# Patient Record
Sex: Female | Born: 1960 | Race: Black or African American | Hispanic: No | Marital: Married | Smoking: Never smoker
Health system: Southern US, Community
[De-identification: ages and names within clinical notes are randomized; demographics above are authoritative.]

## PROBLEM LIST (undated history)

## (undated) DIAGNOSIS — Z8489 Family history of other specified conditions: Secondary | ICD-10-CM

## (undated) DIAGNOSIS — I82409 Acute embolism and thrombosis of unspecified deep veins of unspecified lower extremity: Secondary | ICD-10-CM

## (undated) DIAGNOSIS — R232 Flushing: Secondary | ICD-10-CM

## (undated) DIAGNOSIS — G43909 Migraine, unspecified, not intractable, without status migrainosus: Secondary | ICD-10-CM

## (undated) DIAGNOSIS — K219 Gastro-esophageal reflux disease without esophagitis: Secondary | ICD-10-CM

## (undated) HISTORY — PX: BACK SURGERY: SHX140

## (undated) HISTORY — PX: COLONOSCOPY: SHX174

## (undated) HISTORY — PX: ABDOMINAL HYSTERECTOMY: SHX81

---

## 2002-08-28 HISTORY — PX: CERVICAL DISC SURGERY: SHX588

## 2012-08-28 HISTORY — PX: THROMBECTOMY: PRO61

## 2012-12-26 HISTORY — PX: BREAST BIOPSY: SHX20

## 2014-09-23 ENCOUNTER — Other Ambulatory Visit (HOSPITAL_COMMUNITY): Payer: Self-pay | Admitting: Orthopedic Surgery

## 2014-09-30 NOTE — Pre-Procedure Instructions (Signed)
Anne Byrd  09/30/2014   Your procedure is scheduled on:  Wednesday October 07, 2014 at 8:30 AM.  [facility] at 6:30 AM.  Call this number if you have problems the morning of surgery: [phone]  For any other questions Monday-Friday from 8am-4pm call: [phone]     Remember:   Do not eat food or drink liquids after midnight.   Take these medicines the morning of surgery with A SIP OF WATER:    Do not wear jewelry, make-up or nail polish.  Do not wear lotions, powders, or perfumes.   Do not shave 48 hours prior to surgery.   Do not bring valuables to the hospital.  [facility] is not responsible for any belongings or valuables.               Contacts, dentures or bridgework may not be worn into surgery.  Leave suitcase in the car. After surgery it may be brought to your room.  For patients admitted to the hospital, discharge time is determined by your treatment team.               Patients discharged the day of surgery will not be allowed to drive home.  Name and phone number of your driver:   Special Instructions: Shower using CHG soap the night before and the morning of your surgery   Please read over the following fact sheets that you were given: Pain Booklet, Coughing and Deep Breathing and Surgical Site Infection Prevention

## 2014-10-01 ENCOUNTER — Encounter (HOSPITAL_COMMUNITY)
Admission: RE | Admit: 2014-10-01 | Discharge: 2014-10-01 | Disposition: A | Discharge status: Home / Self Care | Payer: BLUE CROSS/BLUE SHIELD | Source: Ambulatory Visit | Attending: Orthopedic Surgery | Admitting: Orthopedic Surgery

## 2014-10-01 ENCOUNTER — Encounter (HOSPITAL_COMMUNITY): Payer: Self-pay

## 2014-10-01 DIAGNOSIS — I739 Peripheral vascular disease, unspecified: Secondary | ICD-10-CM | POA: Diagnosis not present

## 2014-10-01 DIAGNOSIS — Z86718 Personal history of other venous thrombosis and embolism: Secondary | ICD-10-CM | POA: Diagnosis not present

## 2014-10-01 DIAGNOSIS — M19071 Primary osteoarthritis, right ankle and foot: Secondary | ICD-10-CM | POA: Diagnosis not present

## 2014-10-01 DIAGNOSIS — M76821 Posterior tibial tendinitis, right leg: Secondary | ICD-10-CM | POA: Diagnosis present

## 2014-10-01 DIAGNOSIS — K219 Gastro-esophageal reflux disease without esophagitis: Secondary | ICD-10-CM | POA: Diagnosis not present

## 2014-10-01 DIAGNOSIS — Z91018 Allergy to other foods: Secondary | ICD-10-CM | POA: Diagnosis not present

## 2014-10-01 HISTORY — DX: Flushing: R23.2

## 2014-10-01 HISTORY — DX: Gastro-esophageal reflux disease without esophagitis: K21.9

## 2014-10-01 HISTORY — DX: Family history of other specified conditions: Z84.89

## 2014-10-01 HISTORY — DX: Acute embolism and thrombosis of unspecified deep veins of unspecified lower extremity: I82.409

## 2014-10-01 LAB — COMPREHENSIVE METABOLIC PANEL
ALBUMIN: 4 g/dL (ref 3.5–5.2)
ALT: 14 U/L (ref 0–35)
AST: 21 U/L (ref 0–37)
Alkaline Phosphatase: 77 U/L (ref 39–117)
Anion gap: 7 (ref 5–15)
BUN: 16 mg/dL (ref 6–23)
CO2: 27 mmol/L (ref 19–32)
CREATININE: 1.22 mg/dL — AB (ref 0.50–1.10)
Calcium: 9.7 mg/dL (ref 8.4–10.5)
Chloride: 107 mmol/L (ref 96–112)
GFR calc non Af Amer: 49 mL/min — ABNORMAL LOW (ref >90)
GFR, EST AFRICAN AMERICAN: 57 mL/min — AB (ref >90)
Glucose, Bld: 97 mg/dL (ref 70–99)
Potassium: 3.8 mmol/L (ref 3.5–5.1)
SODIUM: 141 mmol/L (ref 135–145)
Total Bilirubin: 0.6 mg/dL (ref 0.3–1.2)
Total Protein: 7.4 g/dL (ref 6.0–8.3)

## 2014-10-01 LAB — CBC
HCT: 40.3 % (ref 36.0–46.0)
Hemoglobin: 12.9 g/dL (ref 12.0–15.0)
MCH: 28.5 pg (ref 26.0–34.0)
MCHC: 32 g/dL (ref 30.0–36.0)
MCV: 89.2 fL (ref 78.0–100.0)
Platelets: 286 10*3/uL (ref 150–400)
RBC: 4.52 MIL/uL (ref 3.87–5.11)
RDW: 13.3 % (ref 11.5–15.5)
WBC: 6 10*3/uL (ref 4.0–10.5)

## 2014-10-01 LAB — PROTIME-INR
INR: 1.09 (ref 0.00–1.49)
Prothrombin Time: 14.2 seconds (ref 11.6–15.2)

## 2014-10-01 LAB — APTT: APTT: 29 s (ref 24–37)

## 2014-10-01 NOTE — Progress Notes (Signed)
PCP is Dr. Remigio [place] Patient denied having any acute cardiac or pulmonary issues.

## 2014-10-06 MED ORDER — CEFAZOLIN SODIUM-DEXTROSE 2-3 GM-% IV SOLR
2.0000 g | INTRAVENOUS | Status: AC
  Administered 2014-10-07: 2 g via INTRAVENOUS
  Filled 2014-10-06: qty 50

## 2014-10-07 ENCOUNTER — Encounter (HOSPITAL_COMMUNITY)
Admission: RE | Disposition: A | Discharge status: Home / Self Care | Payer: Self-pay | Source: Ambulatory Visit | Attending: Orthopedic Surgery

## 2014-10-07 ENCOUNTER — Ambulatory Visit (HOSPITAL_COMMUNITY)
Admission: RE | Admit: 2014-10-07 | Discharge: 2014-10-08 | Disposition: A | Discharge status: Home / Self Care | Payer: BLUE CROSS/BLUE SHIELD | Source: Ambulatory Visit | Attending: Orthopedic Surgery | Admitting: Orthopedic Surgery

## 2014-10-07 ENCOUNTER — Encounter (HOSPITAL_COMMUNITY): Payer: Self-pay | Admitting: Certified Registered"

## 2014-10-07 ENCOUNTER — Ambulatory Visit (HOSPITAL_COMMUNITY): Payer: BLUE CROSS/BLUE SHIELD | Admitting: Certified Registered"

## 2014-10-07 DIAGNOSIS — M76821 Posterior tibial tendinitis, right leg: Secondary | ICD-10-CM | POA: Insufficient documentation

## 2014-10-07 DIAGNOSIS — M66879 Spontaneous rupture of other tendons, unspecified ankle and foot: Secondary | ICD-10-CM | POA: Diagnosis present

## 2014-10-07 DIAGNOSIS — K219 Gastro-esophageal reflux disease without esophagitis: Secondary | ICD-10-CM | POA: Insufficient documentation

## 2014-10-07 DIAGNOSIS — M19071 Primary osteoarthritis, right ankle and foot: Secondary | ICD-10-CM | POA: Diagnosis not present

## 2014-10-07 DIAGNOSIS — Z86718 Personal history of other venous thrombosis and embolism: Secondary | ICD-10-CM | POA: Insufficient documentation

## 2014-10-07 DIAGNOSIS — Z91018 Allergy to other foods: Secondary | ICD-10-CM | POA: Insufficient documentation

## 2014-10-07 DIAGNOSIS — I739 Peripheral vascular disease, unspecified: Secondary | ICD-10-CM | POA: Insufficient documentation

## 2014-10-07 HISTORY — PX: ANKLE FUSION: SHX5718

## 2014-10-07 SURGERY — ANKLE FUSION
Anesthesia: General | Site: Foot | Laterality: Right

## 2014-10-07 MED ORDER — ONDANSETRON HCL 4 MG/2ML IJ SOLN: 4.0000 mg | Freq: Four times a day (QID) | INTRAMUSCULAR | Status: DC | PRN

## 2014-10-07 MED ORDER — BISACODYL 5 MG PO TBEC: 5.0000 mg | DELAYED_RELEASE_TABLET | Freq: Every day | ORAL | Status: DC | PRN

## 2014-10-07 MED ORDER — EPHEDRINE SULFATE 50 MG/ML IJ SOLN
INTRAMUSCULAR | Status: DC | PRN
  Administered 2014-10-07: 10 mg via INTRAVENOUS

## 2014-10-07 MED ORDER — LACTATED RINGERS IV SOLN
INTRAVENOUS | Status: DC | PRN
  Administered 2014-10-07 (×2): via INTRAVENOUS

## 2014-10-07 MED ORDER — HYDROMORPHONE HCL 1 MG/ML IJ SOLN
0.2500 mg | INTRAMUSCULAR | Status: DC | PRN
  Administered 2014-10-07 (×2): 0.5 mg via INTRAVENOUS

## 2014-10-07 MED ORDER — EPHEDRINE SULFATE 50 MG/ML IJ SOLN
INTRAMUSCULAR | Status: AC
  Filled 2014-10-07: qty 1

## 2014-10-07 MED ORDER — FENTANYL CITRATE 0.05 MG/ML IJ SOLN
INTRAMUSCULAR | Status: AC
  Filled 2014-10-07: qty 5

## 2014-10-07 MED ORDER — BUPIVACAINE-EPINEPHRINE (PF) 0.5% -1:200000 IJ SOLN
INTRAMUSCULAR | Status: DC | PRN
  Administered 2014-10-07: 30 mL via PERINEURAL

## 2014-10-07 MED ORDER — ROCURONIUM BROMIDE 50 MG/5ML IV SOLN
INTRAVENOUS | Status: AC
  Filled 2014-10-07: qty 1

## 2014-10-07 MED ORDER — SUCCINYLCHOLINE CHLORIDE 20 MG/ML IJ SOLN
INTRAMUSCULAR | Status: AC
  Filled 2014-10-07: qty 1

## 2014-10-07 MED ORDER — MIDAZOLAM HCL 2 MG/2ML IJ SOLN
INTRAMUSCULAR | Status: AC
  Filled 2014-10-07: qty 2

## 2014-10-07 MED ORDER — MAGNESIUM CITRATE PO SOLN: 1.0000 | Freq: Once | ORAL | Status: AC | PRN

## 2014-10-07 MED ORDER — OXYCODONE-ACETAMINOPHEN 5-325 MG PO TABS
1.0000 | ORAL_TABLET | ORAL | Status: DC | PRN
  Administered 2014-10-07 – 2014-10-08 (×3): 2 via ORAL
  Filled 2014-10-07 (×3): qty 2

## 2014-10-07 MED ORDER — ARTIFICIAL TEARS OP OINT
TOPICAL_OINTMENT | OPHTHALMIC | Status: DC | PRN
  Administered 2014-10-07: 1 via OPHTHALMIC

## 2014-10-07 MED ORDER — SODIUM CHLORIDE 0.9 % IJ SOLN
INTRAMUSCULAR | Status: AC
  Filled 2014-10-07: qty 10

## 2014-10-07 MED ORDER — PHENYLEPHRINE 40 MCG/ML (10ML) SYRINGE FOR IV PUSH (FOR BLOOD PRESSURE SUPPORT)
PREFILLED_SYRINGE | INTRAVENOUS | Status: AC
  Filled 2014-10-07: qty 10

## 2014-10-07 MED ORDER — ONDANSETRON HCL 4 MG/2ML IJ SOLN
INTRAMUSCULAR | Status: DC | PRN
  Administered 2014-10-07: 4 mg via INTRAVENOUS

## 2014-10-07 MED ORDER — LIDOCAINE HCL (CARDIAC) 20 MG/ML IV SOLN
INTRAVENOUS | Status: AC
  Filled 2014-10-07: qty 5

## 2014-10-07 MED ORDER — INFLUENZA VAC SPLIT QUAD 0.5 ML IM SUSY
0.5000 mL | PREFILLED_SYRINGE | INTRAMUSCULAR | Status: DC
  Filled 2014-10-07 (×2): qty 0.5

## 2014-10-07 MED ORDER — HYDROMORPHONE HCL 1 MG/ML IJ SOLN
INTRAMUSCULAR | Status: AC
  Filled 2014-10-07: qty 1

## 2014-10-07 MED ORDER — FENTANYL CITRATE 0.05 MG/ML IJ SOLN
INTRAMUSCULAR | Status: DC | PRN
  Administered 2014-10-07: 50 ug via INTRAVENOUS
  Administered 2014-10-07: 25 ug via INTRAVENOUS
  Administered 2014-10-07: 50 ug via INTRAVENOUS
  Administered 2014-10-07: 25 ug via INTRAVENOUS

## 2014-10-07 MED ORDER — SODIUM CHLORIDE 0.9 % IV SOLN
INTRAVENOUS | Status: DC
  Administered 2014-10-07: 15:00:00 via INTRAVENOUS

## 2014-10-07 MED ORDER — CEFAZOLIN SODIUM-DEXTROSE 2-3 GM-% IV SOLR
2.0000 g | Freq: Four times a day (QID) | INTRAVENOUS | Status: AC
  Administered 2014-10-07 – 2014-10-08 (×3): 2 g via INTRAVENOUS
  Filled 2014-10-07 (×3): qty 50

## 2014-10-07 MED ORDER — HYDROMORPHONE HCL 1 MG/ML IJ SOLN
0.5000 mg | INTRAMUSCULAR | Status: DC | PRN
  Administered 2014-10-07: 0.5 mg via INTRAVENOUS

## 2014-10-07 MED ORDER — SENNOSIDES-DOCUSATE SODIUM 8.6-50 MG PO TABS
1.0000 | ORAL_TABLET | Freq: Every evening | ORAL | Status: DC | PRN
  Administered 2014-10-08: 1 via ORAL
  Filled 2014-10-07: qty 1

## 2014-10-07 MED ORDER — METHOCARBAMOL 1000 MG/10ML IJ SOLN
500.0000 mg | Freq: Four times a day (QID) | INTRAVENOUS | Status: DC | PRN
  Filled 2014-10-07: qty 5

## 2014-10-07 MED ORDER — HYDROMORPHONE HCL 1 MG/ML IJ SOLN
INTRAMUSCULAR | Status: AC
  Administered 2014-10-07: 0.5 mg via INTRAVENOUS
  Filled 2014-10-07: qty 1

## 2014-10-07 MED ORDER — DOCUSATE SODIUM 100 MG PO CAPS
100.0000 mg | ORAL_CAPSULE | Freq: Two times a day (BID) | ORAL | Status: DC
  Administered 2014-10-07 – 2014-10-08 (×3): 100 mg via ORAL
  Filled 2014-10-07 (×3): qty 1

## 2014-10-07 MED ORDER — PROPOFOL 10 MG/ML IV BOLUS
INTRAVENOUS | Status: DC | PRN
  Administered 2014-10-07: 180 mg via INTRAVENOUS

## 2014-10-07 MED ORDER — OXYCODONE HCL 5 MG/5ML PO SOLN: 5.0000 mg | Freq: Once | ORAL | Status: DC | PRN

## 2014-10-07 MED ORDER — ASPIRIN EC 325 MG PO TBEC
325.0000 mg | DELAYED_RELEASE_TABLET | Freq: Every day | ORAL | Status: DC
  Administered 2014-10-07 – 2014-10-08 (×2): 325 mg via ORAL
  Filled 2014-10-07 (×2): qty 1

## 2014-10-07 MED ORDER — MIDAZOLAM HCL 5 MG/5ML IJ SOLN
INTRAMUSCULAR | Status: DC | PRN
  Administered 2014-10-07: 2 mg via INTRAVENOUS

## 2014-10-07 MED ORDER — PROPOFOL 10 MG/ML IV BOLUS
INTRAVENOUS | Status: AC
  Filled 2014-10-07: qty 20

## 2014-10-07 MED ORDER — METHOCARBAMOL 500 MG PO TABS
500.0000 mg | ORAL_TABLET | Freq: Four times a day (QID) | ORAL | Status: DC | PRN
  Administered 2014-10-07 – 2014-10-08 (×2): 500 mg via ORAL
  Filled 2014-10-07 (×3): qty 1

## 2014-10-07 MED ORDER — PHENYLEPHRINE HCL 10 MG/ML IJ SOLN
INTRAMUSCULAR | Status: DC | PRN
  Administered 2014-10-07 (×2): 120 ug via INTRAVENOUS
  Administered 2014-10-07 (×2): 80 ug via INTRAVENOUS

## 2014-10-07 MED ORDER — ONDANSETRON HCL 4 MG PO TABS: 4.0000 mg | ORAL_TABLET | Freq: Four times a day (QID) | ORAL | Status: DC | PRN

## 2014-10-07 MED ORDER — PROMETHAZINE HCL 25 MG/ML IJ SOLN: 6.2500 mg | INTRAMUSCULAR | Status: DC | PRN

## 2014-10-07 MED ORDER — ONDANSETRON HCL 4 MG/2ML IJ SOLN
INTRAMUSCULAR | Status: AC
  Filled 2014-10-07: qty 2

## 2014-10-07 MED ORDER — ARTIFICIAL TEARS OP OINT
TOPICAL_OINTMENT | OPHTHALMIC | Status: AC
  Filled 2014-10-07: qty 3.5

## 2014-10-07 MED ORDER — OXYCODONE HCL 5 MG PO TABS: 5.0000 mg | ORAL_TABLET | Freq: Once | ORAL | Status: DC | PRN

## 2014-10-07 MED ORDER — METOCLOPRAMIDE HCL 5 MG/ML IJ SOLN: 5.0000 mg | Freq: Three times a day (TID) | INTRAMUSCULAR | Status: DC | PRN

## 2014-10-07 MED ORDER — METOCLOPRAMIDE HCL 5 MG PO TABS
5.0000 mg | ORAL_TABLET | Freq: Three times a day (TID) | ORAL | Status: DC | PRN
  Filled 2014-10-07: qty 2

## 2014-10-07 MED ORDER — 0.9 % SODIUM CHLORIDE (POUR BTL) OPTIME
TOPICAL | Status: DC | PRN
  Administered 2014-10-07: 1000 mL

## 2014-10-07 MED ORDER — KETOROLAC TROMETHAMINE 30 MG/ML IJ SOLN
30.0000 mg | Freq: Once | INTRAMUSCULAR | Status: DC | PRN
Start: 2014-10-07 — End: 2014-10-07

## 2014-10-07 MED ORDER — METHOCARBAMOL 1000 MG/10ML IJ SOLN
500.0000 mg | INTRAVENOUS | Status: DC
  Filled 2014-10-07: qty 5

## 2014-10-07 SURGICAL SUPPLY — 43 items
BANDAGE ESMARK 6X9 LF (GAUZE/BANDAGES/DRESSINGS) ×1 IMPLANT
BIT DRILL CANN 3.2 (BIT) ×1 IMPLANT
BIT DRILL CANN LRG QC 5X300 (BIT) ×2 IMPLANT
BLADE SAW SGTL HD 18.5X60.5X1. (BLADE) ×2 IMPLANT
BLADE SURG 10 STRL SS (BLADE) IMPLANT
BNDG COHESIVE 4X5 TAN STRL (GAUZE/BANDAGES/DRESSINGS) ×2 IMPLANT
BNDG ESMARK 6X9 LF (GAUZE/BANDAGES/DRESSINGS) ×2
BNDG GAUZE ELAST 4 BULKY (GAUZE/BANDAGES/DRESSINGS) ×2 IMPLANT
COVER MAYO STAND STRL (DRAPES) IMPLANT
COVER SURGICAL LIGHT HANDLE (MISCELLANEOUS) ×2 IMPLANT
DRAPE INCISE IOBAN 66X45 STRL (DRAPES) ×2 IMPLANT
DRAPE OEC MINIVIEW 54X84 (DRAPES) ×2 IMPLANT
DRAPE U-SHAPE 47X51 STRL (DRAPES) ×2 IMPLANT
DRILL BIT CANN 3.2 (BIT) ×2
DRSG ADAPTIC 3X8 NADH LF (GAUZE/BANDAGES/DRESSINGS) ×2 IMPLANT
DRSG PAD ABDOMINAL 8X10 ST (GAUZE/BANDAGES/DRESSINGS) ×4 IMPLANT
DURAPREP 26ML APPLICATOR (WOUND CARE) ×2 IMPLANT
ELECT REM PT RETURN 9FT ADLT (ELECTROSURGICAL) ×2
ELECTRODE REM PT RTRN 9FT ADLT (ELECTROSURGICAL) ×1 IMPLANT
GAUZE SPONGE 4X4 12PLY STRL (GAUZE/BANDAGES/DRESSINGS) ×2 IMPLANT
GLOVE BIOGEL PI IND STRL 9 (GLOVE) ×1 IMPLANT
GLOVE BIOGEL PI INDICATOR 9 (GLOVE) ×1
GLOVE SURG ORTHO 9.0 STRL STRW (GLOVE) ×2 IMPLANT
GOWN STRL REUS W/ TWL XL LVL3 (GOWN DISPOSABLE) ×2 IMPLANT
GOWN STRL REUS W/TWL XL LVL3 (GOWN DISPOSABLE) ×2
GUIDEWIRE 1.6 (WIRE) ×2
GUIDEWIRE ORTH 220X1.6XTROC (WIRE) ×2 IMPLANT
GUIDEWIRE THREADED 2.8 (WIRE) ×2 IMPLANT
KIT BASIN OR (CUSTOM PROCEDURE TRAY) ×2 IMPLANT
KIT ROOM TURNOVER OR (KITS) ×2 IMPLANT
NS IRRIG 1000ML POUR BTL (IV SOLUTION) ×2 IMPLANT
PACK ORTHO EXTREMITY (CUSTOM PROCEDURE TRAY) ×2 IMPLANT
PAD ARMBOARD 7.5X6 YLW CONV (MISCELLANEOUS) ×4 IMPLANT
PUTTY BONE DBX 5CC MIX (Putty) ×2 IMPLANT
SCREW 6.5X70MM (Screw) ×2 IMPLANT
SCREW COMPRESISON HL 4.5X44MM (Screw) ×2 IMPLANT
SPONGE LAP 18X18 X RAY DECT (DISPOSABLE) IMPLANT
SUCTION FRAZIER TIP 10 FR DISP (SUCTIONS) ×2 IMPLANT
SUT ETHILON 2 0 PSLX (SUTURE) ×6 IMPLANT
TOWEL OR 17X24 6PK STRL BLUE (TOWEL DISPOSABLE) ×2 IMPLANT
TOWEL OR 17X26 10 PK STRL BLUE (TOWEL DISPOSABLE) ×2 IMPLANT
TUBE CONNECTING 12X1/4 (SUCTIONS) ×2 IMPLANT
WATER STERILE IRR 1000ML POUR (IV SOLUTION) IMPLANT

## 2014-10-07 NOTE — Anesthesia Postprocedure Evaluation (Signed)
  Anesthesia Post-op Note  Patient: Anne Byrd  Procedure(s) Performed: Procedure(s): Subtalar and Talonavicular Fusion (Right)  Patient Location: PACU  Anesthesia Type:GA combined with regional for post-op pain  Level of Consciousness: awake and alert   Airway and Oxygen Therapy: Patient Spontanous Breathing  Post-op Pain: none  Post-op Assessment: Post-op Vital signs reviewed  Post-op Vital Signs: Reviewed  Last Vitals:  Filed Vitals:   10/07/14 1155  BP:   Pulse: 73  Temp:   Resp: 16    Complications: No apparent anesthesia complications

## 2014-10-07 NOTE — Progress Notes (Signed)
Orthopedic Tech Progress Note Patient Details:  Anne SwazilandJordan 09-04-1960 [id number]  Ortho Devices Type of Ortho Device: Postop shoe/boot Ortho Device/Splint Location: rle Ortho Device/Splint Interventions: Application   Boots [place] 10/07/2014, 3:23 PM

## 2014-10-07 NOTE — Anesthesia Preprocedure Evaluation (Addendum)
Anesthesia Evaluation  Patient identified by MRN, date of birth, ID band Patient awake    Reviewed: Allergy & Precautions, NPO status , Patient's Chart, lab work & pertinent test results  Airway Mallampati: III  TM Distance: >3 FB Neck ROM: Full    Dental  (+) Teeth Intact, Partial Upper, Partial Lower, Dental Advisory Given   Pulmonary neg pulmonary ROS,  breath sounds clear to auscultation        Cardiovascular + Peripheral Vascular Disease Rhythm:Regular Rate:Normal     Neuro/Psych negative neurological ROS     GI/Hepatic Neg liver ROS, GERD-  ,  Endo/Other  Morbid obesity  Renal/GU Renal InsufficiencyRenal disease     Musculoskeletal   Abdominal   Peds  Hematology negative hematology ROS (+)   Anesthesia Other Findings   Reproductive/Obstetrics                           Anesthesia Physical Anesthesia Plan  ASA: II  Anesthesia Plan: General   Post-op Pain Management:    Induction: Intravenous  Airway Management Planned: LMA and Oral ETT  Additional Equipment:   Intra-op Plan:   Post-operative Plan: Extubation in OR  Informed Consent: I have reviewed the patients History and Physical, chart, labs and discussed the procedure including the risks, benefits and alternatives for the proposed anesthesia with the patient or authorized representative who has indicated his/her understanding and acceptance.   Dental advisory given  Plan Discussed with: CRNA  Anesthesia Plan Comments:        Anesthesia Quick Evaluation

## 2014-10-07 NOTE — Op Note (Signed)
10/07/2014  9:27 AM  PATIENT:  Anne Byrd    PRE-OPERATIVE DIAGNOSIS:  Posterior Tibial Tendon Insufficiency Right Foot  POST-OPERATIVE DIAGNOSIS:  Same  PROCEDURE:  Subtalar and Talonavicular Fusion  SURGEON:  Nadara MustardUDA,MARCUS V, MD  PHYSICIAN ASSISTANT:None ANESTHESIA:   General  PREOPERATIVE INDICATIONS:  [name] is a  54 y.o. female with a diagnosis of Posterior Tibial Tendon Insufficiency Right Foot who failed conservative measures and elected for surgical management.    The risks benefits and alternatives were discussed with the patient preoperatively including but not limited to the risks of infection, bleeding, nerve injury, cardiopulmonary complications, the need for revision surgery, among others, and the patient was willing to proceed.  OPERATIVE IMPLANTS: 4.5 and 6.5 Synthes headless compression screws  OPERATIVE FINDINGS: Sclerotic bone subtalar joint  OPERATIVE PROCEDURE: Patient was brought to the operating room after undergoing a popliteal block she then underwent a general anesthetic. After adequate levels of anesthesia were obtained patient's right lower extremity was prepped using DuraPrep draped into a sterile field. A timeout was called. An oblique incision was made over the sinus Tarsi of the extensor muscles were reflected distally and using an osteotome curette and oscillating saw the posterior facet and subtalar joint were debrided of eburnated bone and cartilage. This was irrigated with normal saline. Attention was then focused on the talonavicular joint. A dorsal incision was made over the talonavicular joint. The anterior tibial tendon was retracted medially. The talonavicular joint was debrided of articular cartilage. The foot was reduced and taken out of pronation and with neutral alignment of the foot guidewire was inserted from the talus to the navicular. C-arm fluoroscopy verified alignment and this was stabilized with a 4.5 headless cannulated screw. A  separate incision was then made over the calcaneus guidewire was inserted from the calcaneus across subtalar joint into the talus. This was then secured with a 6.5 headless compression screw. C-arm fluoroscopy verified reduction of both AP and lateral planes. The wounds were irrigated with normal saline. Using demineralized bone matrix bone graft was placed in the talonavicular and subtalar joint. The incisions were closed using 2-0 nylon. A sterile compressive dressing was applied. Patient was extubated taken to the PACU in stable condition.

## 2014-10-07 NOTE — Evaluation (Signed)
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 PMH of DVT.  Clinical Impression  Patient presents with functional limitations due to deficits listed in PT problem list (see below). Pt with generalized weakness and balance deficits secondary to NWB status RLE. Requires use of RW for support/safety. A few LOB during dynamic standing/gait today requiring Min A. Education provided on how to donn/doff post op boot and on energy conservation. Pt has 24/7 supervision/assist at home and declining home PT. Would benefit from acute therapy to improve transfers, gait, endurance and mobility so pt can maximize independence prior to return home.     Follow Up Recommendations Supervision/Assistance - 24 hour;No PT follow up    Equipment Recommendations  Rolling walker with 5" wheels    Recommendations for Other Services       Precautions / Restrictions Precautions Precautions: Fall Required Braces or Orthoses: Other Brace/Splint Other Brace/Splint: post op shoe RLE. Restrictions Weight Bearing Restrictions: Yes RLE Weight Bearing: Non weight bearing      Mobility  Bed Mobility Overal bed mobility: Needs Assistance Bed Mobility: Supine to Sit;Sit to Supine     Supine to sit: Modified independent (Device/Increase time);HOB elevated Sit to supine: Modified independent (Device/Increase time);HOB elevated      Transfers Overall transfer level: Needs assistance Equipment used: Rolling walker (2 wheeled) Transfers: Sit to/from Stand Sit to Stand: Min assist         General transfer comment: Min A to steady upon standing. Cues for hand placement. Compliant with NWB RLE. Stood from [employer], from toilet x1.  Ambulation/Gait Ambulation/Gait  assistance: Min assist Ambulation Distance (Feet): 22 Feet (x2 bouts) Assistive device: Rolling walker (2 wheeled) Gait Pattern/deviations: Step-to pattern;Decreased stride length;Trunk flexed   Gait velocity interpretation: Below normal speed for age/gender General Gait Details: Pt demonstrates hopping pattern through LLE with short standing restbreaks due to fatigue. 1 few LOB requiring Min A to correct.   Stairs            Wheelchair Mobility    Modified Rankin (Stroke Patients Only)       Balance Overall balance assessment: Needs assistance Sitting-balance support: Feet supported;No upper extremity supported Sitting balance-Leahy Scale: Fair Sitting balance - Comments: total A to donn post op shoe.   Standing balance support: During functional activity Standing balance-Leahy Scale: Poor Standing balance comment: 2 LOB while washing hands at sink requiring Min A to prevent fall resulting in WB through RLE. Requires BUE support during gait due to WB status and balance.                              Pertinent Vitals/Pain Pain Assessment: No/denies pain    Home Living Family/patient expects to be discharged to:: Private residence Living Arrangements: Spouse/significant other;Parent;Children Available Help at Discharge: Family;Available 24 hours/day Type of Home: House Home Access: Level entry     Home Layout: One level Home Equipment: None      Prior Function Level of Independence: Independent               Hand Dominance        Extremity/Trunk Assessment   Upper Extremity Assessment: Defer to OT evaluation  Lower Extremity Assessment: Generalized weakness;RLE deficits/detail RLE Deficits / Details: not able to wiggle toes. Diminished sensation RLE most likely residual from nerve block.       Communication   Communication: No difficulties  Cognition Arousal/Alertness: Awake/alert Behavior During Therapy: WFL for tasks  assessed/performed Overall Cognitive Status: Within Functional Limits for tasks assessed                      General Comments      Exercises        Assessment/Plan    PT Assessment Patient needs continued PT services  PT Diagnosis Difficulty walking;Generalized weakness;Acute pain   PT Problem List Decreased strength;Pain;Impaired sensation;Decreased activity tolerance;Decreased knowledge of use of DME;Decreased mobility;Decreased balance;Decreased safety awareness  PT Treatment Interventions Balance training;Gait training;Patient/family education;Functional mobility training;Therapeutic exercise;Therapeutic activities;DME instruction   PT Goals (Current goals can be found in the Care Plan section) Acute Rehab PT Goals Patient Stated Goal: to return home PT Goal Formulation: With patient Time For Goal Achievement: 10/21/14 Potential to Achieve Goals: Fair    Frequency Min 3X/week   Barriers to discharge        Co-evaluation               End of Session Equipment Utilized During Treatment: Gait belt;Other (comment) (post op shoe) Activity Tolerance: Patient limited by fatigue Patient left: in bed;with call bell/phone within reach;with bed alarm set;with family/visitor present Nurse Communication: Mobility status    Functional Assessment Tool Used: Clinical judgment Functional Limitation: Mobility: Walking and moving around Mobility: Walking and Moving Around Current Status ([id number]): At least 20 percent but less than 40 percent impaired, limited or restricted Mobility: Walking and Moving Around Goal Status [phone]): At least 1 percent but less than 20 percent impaired, limited or restricted    Time: 1633-1650 PT Time Calculation (min) (ACUTE ONLY): 17 min   Charges:   PT Evaluation $Initial PT Evaluation Tier I: 1 Procedure     PT G Codes:   PT G-Codes **NOT FOR INPATIENT CLASS** Functional Assessment Tool Used: Clinical judgment Functional  Limitation: Mobility: Walking and moving around Mobility: Walking and Moving Around Current Status ([id number]([id number]): At least 20 percent but less than 40 percent impaired, limited or restricted Mobility: Walking and Moving Around Goal Status [phone]([phone]): At least 1 percent but less than 20 percent impaired, limited or restricted    Alvie HeidelbergFolan, Cynthie Garmon A 10/07/2014, 4:57 PM Alvie HeidelbergShauna Folan, PT, DPT [phone]

## 2014-10-07 NOTE — Anesthesia Procedure Notes (Addendum)
Anesthesia Regional Block:  Popliteal block  Pre-Anesthetic Checklist: ,, timeout performed, Correct Patient, Correct Site, Correct Laterality, Correct Procedure, Correct Position, site marked, Risks and benefits discussed,  Surgical consent,  Pre-op evaluation,  At surgeon's request and post-op pain management   Prep: chloraprep       Needles:  Injection technique: Single-shot  Needle Type: Echogenic Stimulator Needle     Needle Length: 9cm 9 cm Needle Gauge: 21 and 21 G    Additional Needles:  Procedures: ultrasound guided (picture in chart) and nerve stimulator Popliteal block  Nerve Stimulator or Paresthesia:  Response: tibial, 0.6 mA,   Additional Responses:   Narrative:  Start time: 10/07/2014 8:10 AM End time: 10/07/2014 8:20 AM Injection made incrementally with aspirations every 5 mL.  Performed by: Personally  Anesthesiologist: Marcene DuosFITZGERALD, ROBERT E  Additional Notes: Risks and benefits explained to patient. Pt tolerated procedure well with no immediate complications.   Procedure Name: LMA Insertion Date/Time: 10/07/2014 8:38 AM Performed by: Jefm MilesENNIE, Gopal Malter E Pre-anesthesia Checklist: Patient identified, Emergency Drugs available, Suction available, Patient being monitored and Timeout performed Patient Re-evaluated:Patient Re-evaluated prior to inductionOxygen Delivery Method: Circle system utilized Preoxygenation: Pre-oxygenation with 100% oxygen Intubation Type: IV induction Ventilation: Mask ventilation without difficulty LMA: LMA inserted LMA Size: 4.0 Number of attempts: 1 Placement Confirmation: positive ETCO2 and breath sounds checked- equal and bilateral Tube secured with: Tape Dental Injury: Teeth and Oropharynx as per pre-operative assessment

## 2014-10-07 NOTE — Transfer of Care (Signed)
Immediate Anesthesia Transfer of Care Note  Patient: Anne Byrd  Procedure(s) Performed: Procedure(s): Subtalar and Talonavicular Fusion (Right)  Patient Location: PACU  Anesthesia Type:General  Level of Consciousness: lethargic and responds to stimulation  Airway & Oxygen Therapy: Patient Spontanous Breathing and Patient connected to nasal cannula oxygen  Post-op Assessment: Report given to RN  Post vital signs: Reviewed and stable  Last Vitals:  Filed Vitals:   10/07/14 0638  BP: 129/77  Pulse:   Temp:     Complications: No apparent anesthesia complications

## 2014-10-07 NOTE — H&P (Signed)
Anne Byrd is an 54 y.o. female.   Chief Complaint: Posterior tibial tendon insufficiency with pronated valgus forefoot on the right HPI: Patient is a 54 year old woman with chronic pain secondary to posterior tibial tendon insufficiency right foot patient is failed prolonged conservative therapy and presents at this time for surgical intervention.  Past Medical History  Diagnosis Date  . DVT of leg (deep venous thrombosis)     left  . Family history of adverse reaction to anesthesia     Patients mother had a difficult time waking you after anesthesia  . GERD (gastroesophageal reflux disease)     Takes Nexium OTC if needed  . Hot flashes     gets hot flashes when pt is gaining weight    Past Surgical History  Procedure Laterality Date  . Leg surgery Left   . Breast biopsy Left   . Cervical disc surgery  2004  . Colonoscopy    . Abdominal hysterectomy      No family history on file. Social History:  reports that she has never smoked. She does not have any smokeless tobacco history on file. She reports that she drinks alcohol. She reports that she does not use illicit drugs.  Allergies:  Allergies  Allergen Reactions  . Other Anaphylaxis    Black Walnuts    No prescriptions prior to admission    No results found for this or any previous visit (from the past 48 hour(s)). No results found.  Review of Systems  All other systems reviewed and are negative.   There were no vitals taken for this visit. Physical Exam  On examination patient has a palpable pulse she has a fixed pronated valgus hindfoot impingement laterally tenderness along the posterior tibial tendon. Assessment/Plan Assessment: Posterior tibial tendon insufficiency right foot.  Plan: We'll plan for a right subtalar and talonavicular fusion. Risks and benefits of surgery were discussed including infection neurovascular injury pain DVT. Patient states she understands wish to proceed at this  time.  Anne Byrd V 10/07/2014, 6:25 AM

## 2014-10-08 DIAGNOSIS — M19071 Primary osteoarthritis, right ankle and foot: Secondary | ICD-10-CM | POA: Diagnosis not present

## 2014-10-08 MED ORDER — OXYCODONE-ACETAMINOPHEN 5-325 MG PO TABS: 1.0000 | ORAL_TABLET | ORAL | Status: AC | PRN

## 2014-10-08 NOTE — Progress Notes (Signed)
10/08/14 PT recommended rolling walker. Spoke with patient then contacted Homero FellersFrank with [facility] and requested rolling walker be delivered to patient's room. No other d/c needs identified.

## 2014-10-08 NOTE — Discharge Summary (Signed)
  Discharge to home in stable condition. 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 Surgical procedure right subtalar and talonavicular fusion. Follow-up in the office next week. Physical therapy minimize weightbearing right lower extremity. Prescription for Percocet for pain.

## 2014-10-09 ENCOUNTER — Encounter (HOSPITAL_COMMUNITY): Payer: Self-pay | Admitting: Orthopedic Surgery

## 2014-11-04 ENCOUNTER — Other Ambulatory Visit (HOSPITAL_COMMUNITY): Payer: Self-pay | Admitting: Orthopedic Surgery

## 2014-11-04 ENCOUNTER — Encounter (HOSPITAL_COMMUNITY): Payer: Self-pay | Admitting: *Deleted

## 2014-11-05 ENCOUNTER — Encounter (HOSPITAL_COMMUNITY): Payer: Self-pay | Admitting: *Deleted

## 2014-11-05 ENCOUNTER — Ambulatory Visit (HOSPITAL_COMMUNITY): Payer: BLUE CROSS/BLUE SHIELD | Admitting: Anesthesiology

## 2014-11-05 ENCOUNTER — Encounter (HOSPITAL_COMMUNITY)
Admission: RE | Disposition: A | Discharge status: Home / Self Care | Payer: Self-pay | Source: Ambulatory Visit | Attending: Orthopedic Surgery

## 2014-11-05 ENCOUNTER — Ambulatory Visit (HOSPITAL_COMMUNITY)
Admission: RE | Admit: 2014-11-05 | Discharge: 2014-11-06 | Disposition: A | Discharge status: Home / Self Care | Payer: BLUE CROSS/BLUE SHIELD | Source: Ambulatory Visit | Attending: Orthopedic Surgery | Admitting: Orthopedic Surgery

## 2014-11-05 DIAGNOSIS — Z91018 Allergy to other foods: Secondary | ICD-10-CM | POA: Insufficient documentation

## 2014-11-05 DIAGNOSIS — L02611 Cutaneous abscess of right foot: Secondary | ICD-10-CM | POA: Insufficient documentation

## 2014-11-05 DIAGNOSIS — Z86718 Personal history of other venous thrombosis and embolism: Secondary | ICD-10-CM | POA: Insufficient documentation

## 2014-11-05 DIAGNOSIS — K219 Gastro-esophageal reflux disease without esophagitis: Secondary | ICD-10-CM | POA: Diagnosis not present

## 2014-11-05 HISTORY — PX: I & D EXTREMITY: SHX5045

## 2014-11-05 HISTORY — PX: INCISION AND DRAINAGE FOOT: SHX1800

## 2014-11-05 HISTORY — DX: Migraine, unspecified, not intractable, without status migrainosus: G43.909

## 2014-11-05 LAB — COMPREHENSIVE METABOLIC PANEL
ALT: 22 U/L (ref 0–35)
AST: 24 U/L (ref 0–37)
Albumin: 3.4 g/dL — ABNORMAL LOW (ref 3.5–5.2)
Alkaline Phosphatase: 89 U/L (ref 39–117)
Anion gap: 7 (ref 5–15)
BUN: 10 mg/dL (ref 6–23)
CO2: 29 mmol/L (ref 19–32)
Calcium: 9.5 mg/dL (ref 8.4–10.5)
Chloride: 104 mmol/L (ref 96–112)
Creatinine, Ser: 0.84 mg/dL (ref 0.50–1.10)
GFR calc Af Amer: 90 mL/min — ABNORMAL LOW (ref >90)
GFR, EST NON AFRICAN AMERICAN: 77 mL/min — AB (ref >90)
GLUCOSE: 97 mg/dL (ref 70–99)
Potassium: 4.1 mmol/L (ref 3.5–5.1)
Sodium: 140 mmol/L (ref 135–145)
TOTAL PROTEIN: 7.5 g/dL (ref 6.0–8.3)
Total Bilirubin: 0.6 mg/dL (ref 0.3–1.2)

## 2014-11-05 LAB — CBC
HEMATOCRIT: 35.2 % — AB (ref 36.0–46.0)
Hemoglobin: 11.4 g/dL — ABNORMAL LOW (ref 12.0–15.0)
MCH: 28.4 pg (ref 26.0–34.0)
MCHC: 32.4 g/dL (ref 30.0–36.0)
MCV: 87.8 fL (ref 78.0–100.0)
Platelets: 271 10*3/uL (ref 150–400)
RBC: 4.01 MIL/uL (ref 3.87–5.11)
RDW: 13.4 % (ref 11.5–15.5)
WBC: 5.3 10*3/uL (ref 4.0–10.5)

## 2014-11-05 LAB — PROTIME-INR
INR: 1.08 (ref 0.00–1.49)
Prothrombin Time: 14.1 seconds (ref 11.6–15.2)

## 2014-11-05 LAB — APTT: aPTT: 30 seconds (ref 24–37)

## 2014-11-05 SURGERY — IRRIGATION AND DEBRIDEMENT EXTREMITY
Anesthesia: General | Laterality: Right

## 2014-11-05 MED ORDER — FENTANYL CITRATE 0.05 MG/ML IJ SOLN
INTRAMUSCULAR | Status: DC | PRN
  Administered 2014-11-05 (×2): 50 ug via INTRAVENOUS
  Administered 2014-11-05: 100 ug via INTRAVENOUS
  Administered 2014-11-05: 50 ug via INTRAVENOUS

## 2014-11-05 MED ORDER — METOCLOPRAMIDE HCL 5 MG/ML IJ SOLN: 5.0000 mg | Freq: Three times a day (TID) | INTRAMUSCULAR | Status: DC | PRN

## 2014-11-05 MED ORDER — LIDOCAINE HCL (CARDIAC) 20 MG/ML IV SOLN
INTRAVENOUS | Status: AC
  Filled 2014-11-05: qty 5

## 2014-11-05 MED ORDER — HYDROMORPHONE HCL 1 MG/ML IJ SOLN
0.2500 mg | INTRAMUSCULAR | Status: DC | PRN
  Administered 2014-11-05 (×2): 0.25 mg via INTRAVENOUS
  Administered 2014-11-05 (×3): 0.5 mg via INTRAVENOUS

## 2014-11-05 MED ORDER — CEFAZOLIN SODIUM-DEXTROSE 2-3 GM-% IV SOLR
2.0000 g | INTRAVENOUS | Status: DC
  Filled 2014-11-05: qty 50

## 2014-11-05 MED ORDER — PROPOFOL 10 MG/ML IV BOLUS
INTRAVENOUS | Status: DC | PRN
  Administered 2014-11-05: 200 mg via INTRAVENOUS

## 2014-11-05 MED ORDER — GENTAMICIN SULFATE 40 MG/ML IJ SOLN
INTRAMUSCULAR | Status: DC | PRN
  Administered 2014-11-05: 160 mg via INTRAMUSCULAR

## 2014-11-05 MED ORDER — ONDANSETRON HCL 4 MG/2ML IJ SOLN
INTRAMUSCULAR | Status: AC
  Filled 2014-11-05: qty 2

## 2014-11-05 MED ORDER — GENTAMICIN SULFATE 40 MG/ML IJ SOLN
INTRAMUSCULAR | Status: AC
  Filled 2014-11-05: qty 2

## 2014-11-05 MED ORDER — SODIUM CHLORIDE 0.9 % IJ SOLN
INTRAMUSCULAR | Status: AC
  Filled 2014-11-05: qty 10

## 2014-11-05 MED ORDER — ONDANSETRON HCL 4 MG/2ML IJ SOLN: 4.0000 mg | Freq: Four times a day (QID) | INTRAMUSCULAR | Status: DC | PRN

## 2014-11-05 MED ORDER — METOCLOPRAMIDE HCL 10 MG PO TABS: 5.0000 mg | ORAL_TABLET | Freq: Three times a day (TID) | ORAL | Status: DC | PRN

## 2014-11-05 MED ORDER — HYDROMORPHONE HCL 1 MG/ML IJ SOLN
INTRAMUSCULAR | Status: AC
  Administered 2014-11-05: 0.5 mg via INTRAVENOUS
  Filled 2014-11-05: qty 1

## 2014-11-05 MED ORDER — METHOCARBAMOL 500 MG PO TABS
500.0000 mg | ORAL_TABLET | Freq: Four times a day (QID) | ORAL | Status: DC | PRN
  Administered 2014-11-05 – 2014-11-06 (×2): 500 mg via ORAL
  Filled 2014-11-05 (×2): qty 1

## 2014-11-05 MED ORDER — LIDOCAINE HCL (CARDIAC) 20 MG/ML IV SOLN
INTRAVENOUS | Status: DC | PRN
  Administered 2014-11-05: 60 mg via INTRAVENOUS

## 2014-11-05 MED ORDER — ONDANSETRON HCL 4 MG PO TABS: 4.0000 mg | ORAL_TABLET | Freq: Four times a day (QID) | ORAL | Status: DC | PRN

## 2014-11-05 MED ORDER — OXYCODONE-ACETAMINOPHEN 5-325 MG PO TABS
ORAL_TABLET | ORAL | Status: AC
  Filled 2014-11-05: qty 2

## 2014-11-05 MED ORDER — LACTATED RINGERS IV SOLN
INTRAVENOUS | Status: DC
  Administered 2014-11-05: 10:00:00 via INTRAVENOUS

## 2014-11-05 MED ORDER — DIPHENHYDRAMINE HCL 25 MG PO CAPS
25.0000 mg | ORAL_CAPSULE | Freq: Four times a day (QID) | ORAL | Status: DC | PRN
  Administered 2014-11-05: 25 mg via ORAL
  Filled 2014-11-05: qty 1

## 2014-11-05 MED ORDER — 0.9 % SODIUM CHLORIDE (POUR BTL) OPTIME
TOPICAL | Status: DC | PRN
  Administered 2014-11-05: 1000 mL

## 2014-11-05 MED ORDER — VANCOMYCIN HCL 500 MG IV SOLR
INTRAVENOUS | Status: AC
  Filled 2014-11-05: qty 500

## 2014-11-05 MED ORDER — FENTANYL CITRATE 0.05 MG/ML IJ SOLN
INTRAMUSCULAR | Status: AC
  Filled 2014-11-05: qty 5

## 2014-11-05 MED ORDER — ENSURE COMPLETE PO LIQD
237.0000 mL | Freq: Two times a day (BID) | ORAL | Status: DC
  Administered 2014-11-06: 237 mL via ORAL

## 2014-11-05 MED ORDER — SODIUM CHLORIDE 0.9 % IV SOLN
INTRAVENOUS | Status: DC
  Administered 2014-11-05: 16:00:00 via INTRAVENOUS

## 2014-11-05 MED ORDER — ASPIRIN EC 325 MG PO TBEC
325.0000 mg | DELAYED_RELEASE_TABLET | Freq: Every day | ORAL | Status: DC
  Administered 2014-11-05 – 2014-11-06 (×2): 325 mg via ORAL
  Filled 2014-11-05 (×2): qty 1

## 2014-11-05 MED ORDER — CEFAZOLIN SODIUM 1-5 GM-% IV SOLN
1.0000 g | Freq: Four times a day (QID) | INTRAVENOUS | Status: AC
  Administered 2014-11-05 – 2014-11-06 (×3): 1 g via INTRAVENOUS
  Filled 2014-11-05 (×3): qty 50

## 2014-11-05 MED ORDER — DEXTROSE 5 % IV SOLN
500.0000 mg | Freq: Four times a day (QID) | INTRAVENOUS | Status: DC | PRN
  Filled 2014-11-05: qty 5

## 2014-11-05 MED ORDER — DOCUSATE SODIUM 100 MG PO CAPS
100.0000 mg | ORAL_CAPSULE | Freq: Two times a day (BID) | ORAL | Status: DC
  Administered 2014-11-05 – 2014-11-06 (×3): 100 mg via ORAL
  Filled 2014-11-05 (×3): qty 1

## 2014-11-05 MED ORDER — ONDANSETRON HCL 4 MG/2ML IJ SOLN: 4.0000 mg | Freq: Once | INTRAMUSCULAR | Status: DC | PRN

## 2014-11-05 MED ORDER — HYDROMORPHONE HCL 1 MG/ML IJ SOLN
0.5000 mg | INTRAMUSCULAR | Status: DC | PRN
  Administered 2014-11-05: 1 mg via INTRAVENOUS
  Filled 2014-11-05: qty 1

## 2014-11-05 MED ORDER — OXYCODONE-ACETAMINOPHEN 5-325 MG PO TABS
1.0000 | ORAL_TABLET | ORAL | Status: DC | PRN
  Administered 2014-11-05 – 2014-11-06 (×4): 2 via ORAL
  Filled 2014-11-05 (×3): qty 2

## 2014-11-05 MED ORDER — VANCOMYCIN HCL 500 MG IV SOLR
INTRAVENOUS | Status: DC | PRN
  Administered 2014-11-05: 500 mg

## 2014-11-05 MED ORDER — EPHEDRINE SULFATE 50 MG/ML IJ SOLN
INTRAMUSCULAR | Status: AC
  Filled 2014-11-05: qty 1

## 2014-11-05 MED ORDER — LACTATED RINGERS IV SOLN
INTRAVENOUS | Status: DC | PRN
  Administered 2014-11-05 (×2): via INTRAVENOUS

## 2014-11-05 MED ORDER — METHOCARBAMOL 500 MG PO TABS
ORAL_TABLET | ORAL | Status: AC
  Filled 2014-11-05: qty 1

## 2014-11-05 SURGICAL SUPPLY — 44 items
BLADE SURG 10 STRL SS (BLADE) IMPLANT
BNDG COHESIVE 4X5 TAN STRL (GAUZE/BANDAGES/DRESSINGS) ×3 IMPLANT
BNDG COHESIVE 6X5 TAN STRL LF (GAUZE/BANDAGES/DRESSINGS) IMPLANT
BNDG GAUZE ELAST 4 BULKY (GAUZE/BANDAGES/DRESSINGS) ×3 IMPLANT
COVER SURGICAL LIGHT HANDLE (MISCELLANEOUS) ×3 IMPLANT
CUFF TOURNIQUET SINGLE 18IN (TOURNIQUET CUFF) IMPLANT
CUFF TOURNIQUET SINGLE 24IN (TOURNIQUET CUFF) IMPLANT
CUFF TOURNIQUET SINGLE 34IN LL (TOURNIQUET CUFF) IMPLANT
CUFF TOURNIQUET SINGLE 44IN (TOURNIQUET CUFF) IMPLANT
DRAPE U-SHAPE 47X51 STRL (DRAPES) ×3 IMPLANT
DRSG ADAPTIC 3X8 NADH LF (GAUZE/BANDAGES/DRESSINGS) ×3 IMPLANT
DRSG EMULSION OIL 3X3 NADH (GAUZE/BANDAGES/DRESSINGS) ×3 IMPLANT
DRSG PAD ABDOMINAL 8X10 ST (GAUZE/BANDAGES/DRESSINGS) ×3 IMPLANT
DURAPREP 26ML APPLICATOR (WOUND CARE) ×3 IMPLANT
ELECT CAUTERY BLADE 6.4 (BLADE) IMPLANT
ELECT REM PT RETURN 9FT ADLT (ELECTROSURGICAL)
ELECTRODE REM PT RTRN 9FT ADLT (ELECTROSURGICAL) IMPLANT
GAUZE SPONGE 4X4 12PLY STRL (GAUZE/BANDAGES/DRESSINGS) ×3 IMPLANT
GLOVE BIOGEL PI IND STRL 9 (GLOVE) ×1 IMPLANT
GLOVE BIOGEL PI INDICATOR 9 (GLOVE) ×2
GLOVE SURG ORTHO 9.0 STRL STRW (GLOVE) ×3 IMPLANT
GOWN STRL REUS W/ TWL XL LVL3 (GOWN DISPOSABLE) ×2 IMPLANT
GOWN STRL REUS W/TWL XL LVL3 (GOWN DISPOSABLE) ×4
HANDPIECE INTERPULSE COAX TIP (DISPOSABLE)
KIT BASIN OR (CUSTOM PROCEDURE TRAY) ×3 IMPLANT
KIT ROOM TURNOVER OR (KITS) ×3 IMPLANT
KIT STIMULAN RAPID CURE 5CC (Orthopedic Implant) ×3 IMPLANT
MANIFOLD NEPTUNE II (INSTRUMENTS) ×3 IMPLANT
NS IRRIG 1000ML POUR BTL (IV SOLUTION) ×3 IMPLANT
PACK ORTHO EXTREMITY (CUSTOM PROCEDURE TRAY) ×3 IMPLANT
PAD ARMBOARD 7.5X6 YLW CONV (MISCELLANEOUS) ×6 IMPLANT
PADDING CAST COTTON 6X4 STRL (CAST SUPPLIES) ×3 IMPLANT
SET HNDPC FAN SPRY TIP SCT (DISPOSABLE) IMPLANT
SPONGE GAUZE 4X4 12PLY STER LF (GAUZE/BANDAGES/DRESSINGS) ×3 IMPLANT
SPONGE LAP 18X18 X RAY DECT (DISPOSABLE) ×3 IMPLANT
STOCKINETTE IMPERVIOUS 9X36 MD (GAUZE/BANDAGES/DRESSINGS) IMPLANT
TOWEL OR 17X24 6PK STRL BLUE (TOWEL DISPOSABLE) ×3 IMPLANT
TOWEL OR 17X26 10 PK STRL BLUE (TOWEL DISPOSABLE) ×3 IMPLANT
TUBE ANAEROBIC SPECIMEN COL (MISCELLANEOUS) IMPLANT
TUBE CONNECTING 12'X1/4 (SUCTIONS) ×1
TUBE CONNECTING 12X1/4 (SUCTIONS) ×2 IMPLANT
UNDERPAD 30X30 INCONTINENT (UNDERPADS AND DIAPERS) ×3 IMPLANT
WATER STERILE IRR 1000ML POUR (IV SOLUTION) ×3 IMPLANT
YANKAUER SUCT BULB TIP NO VENT (SUCTIONS) ×3 IMPLANT

## 2014-11-05 NOTE — Anesthesia Preprocedure Evaluation (Signed)
Anesthesia Evaluation  Patient identified by MRN, date of birth, ID band Patient awake    Reviewed: Allergy & Precautions, NPO status , Patient's Chart, lab work & pertinent test results  Airway        Dental   Pulmonary          Cardiovascular + Peripheral Vascular Disease     Neuro/Psych    GI/Hepatic GERD-  ,  Endo/Other    Renal/GU      Musculoskeletal   Abdominal   Peds  Hematology   Anesthesia Other Findings   Reproductive/Obstetrics                             Anesthesia Physical Anesthesia Plan  ASA: II  Anesthesia Plan: General   Post-op Pain Management:    Induction: Intravenous  Airway Management Planned: LMA  Additional Equipment:   Intra-op Plan:   Post-operative Plan: Extubation in OR  Informed Consent: I have reviewed the patients History and Physical, chart, labs and discussed the procedure including the risks, benefits and alternatives for the proposed anesthesia with the patient or authorized representative who has indicated his/her understanding and acceptance.     Plan Discussed with: Surgeon, Anesthesiologist and CRNA  Anesthesia Plan Comments:         Anesthesia Quick Evaluation

## 2014-11-05 NOTE — Anesthesia Postprocedure Evaluation (Signed)
  Anesthesia Post-op Note  Patient: Anne Byrd  Procedure(s) Performed: Procedure(s): IRRIGATION AND DEBRIDEMENT RIGHT FOOT, PLACE ANTIBIOTIC BEADS (Right)  Patient Location: PACU  Anesthesia Type:General and GA combined with regional for post-op pain  Level of Consciousness: awake, alert , oriented and patient cooperative  Airway and Oxygen Therapy: Patient Spontanous Breathing  Post-op Pain: mild  Post-op Assessment: Post-op Vital signs reviewed, Patient's Cardiovascular Status Stable, Respiratory Function Stable, Patent Airway, No signs of Nausea or vomiting and Pain level controlled  Post-op Vital Signs: stable  Last Vitals:  Filed Vitals:   11/05/14 1315  BP: 125/68  Pulse: 96  Temp:   Resp: 15    Complications: No apparent anesthesia complications

## 2014-11-05 NOTE — Transfer of Care (Signed)
Immediate Anesthesia Transfer of Care Note  Patient: Anne Byrd  Procedure(s) Performed: Procedure(s): IRRIGATION AND DEBRIDEMENT RIGHT FOOT, PLACE ANTIBIOTIC BEADS (Right)  Patient Location: PACU  Anesthesia Type:General  Level of Consciousness: awake, alert , sedated and patient cooperative  Airway & Oxygen Therapy: Patient Spontanous Breathing and Patient connected to face mask oxygen  Post-op Assessment: Report given to RN, Post -op Vital signs reviewed and stable and Patient moving all extremities  Post vital signs: Reviewed and stable  Last Vitals:  Filed Vitals:   11/05/14 0904  BP: 124/71  Pulse: 82  Temp: 36.7 C  Resp: 20    Complications: No apparent anesthesia complications

## 2014-11-05 NOTE — Op Note (Signed)
11/05/2014  12:22 PM  PATIENT:  Anne Byrd    PRE-OPERATIVE DIAGNOSIS:  Infected Right Foot  POST-OPERATIVE DIAGNOSIS:  Same  PROCEDURE:  IRRIGATION AND DEBRIDEMENT RIGHT FOOT,  PLACE ANTIBIOTIC BEADS, Local tissue rearrangement 6 x 3 cm for wound closure  SURGEON:  DUDA,MARCUS V, MD  PHYSICIAN ASSISTANT:None ANESTHESIA:   General  PREOPERATIVE INDICATIONS:  [name] is a  54 y.o. female with a diagnosis of Infected Right Foot who failed conservative measures and elected for surgical management.    The risks benefits and alternatives were discussed with the patient preoperatively including but not limited to the risks of infection, bleeding, nerve injury, cardiopulmonary complications, the need for revision surgery, among others, and the patient was willing to proceed.  OPERATIVE IMPLANTS: Stimulant antibiotic beads with 500 mg vancomycin and 160 mg gentamicin  OPERATIVE FINDINGS: Soft tissue abscess dorsum right foot which did not communicate with the talonavicular fusion  OPERATIVE PROCEDURE: Patient was brought to the operating room and underwent a general anesthetic. After adequate levels of anesthesia were obtained patient underwent a ankle and digital block and patient's right lower extremity was prepped using DuraPrep draped into a sterile field. A timeout was called. A elliptical incision was made around the ulcer on the dorsum the foot. There is a small amount of purulence. The soft tissue was resected and sent for tissue cultures. Necrotic tissue was removed with Ronjair knife and sharp dissection. The extensor tendon and the fusion site were undisturbed and did not communicate with the ulcer. The wound was irrigated with normal saline. Antibiotic beads were mixed and placed within the wound. Local tissue rearrangement was performed for wound closure with the wound 6 cm x 3 cm. This was closed with 2-0 nylon. A sterile compressive dressing was applied. Patient was  extubated taken to the PACU in stable condition.

## 2014-11-05 NOTE — Anesthesia Procedure Notes (Addendum)
Procedure Name: LMA Insertion Date/Time: 11/05/2014 11:54 AM Performed by: Coralee RudFLORES, ROBERT Pre-anesthesia Checklist: Patient identified, Emergency Drugs available, Suction available and Patient being monitored Patient Re-evaluated:Patient Re-evaluated prior to inductionOxygen Delivery Method: Circle system utilized Preoxygenation: Pre-oxygenation with 100% oxygen Intubation Type: IV induction Ventilation: Mask ventilation without difficulty LMA: LMA inserted LMA Size: 4.0 Number of attempts: 1 Placement Confirmation: positive ETCO2 Tube secured with: Tape Dental Injury: Teeth and Oropharynx as per pre-operative assessment    Anesthesia Regional Block:  Ankle block  Pre-Anesthetic Checklist: ,, timeout performed, Correct Patient, Correct Site, Correct Laterality, Correct Procedure, Correct Position, site marked, Risks and benefits discussed,  Surgical consent,  Pre-op evaluation,  At surgeon's request and post-op pain management  Laterality: Right  Prep: chloraprep       Needles:  Injection technique: Single-shot      Needle Gauge: 25 and 25 G    Additional Needles: Ankle block Narrative:  Start time: 11/05/2014 11:55 AM End time: 11/05/2014 11:57 AM Injection made incrementally with aspirations every 5 mL.  Performed by: Personally  Anesthesiologist: Laverle HobbySMITH, Thressa Shiffer  Additional Notes: Pt accepts procedure w/ risks. 20 cc 0.5% Marcaine w/o epi Ant Tibial and Post Tibial w/o difficulty. GES

## 2014-11-05 NOTE — Progress Notes (Signed)
Orthopedic Tech Progress Note Patient Details:  Ahley D Swaz[place] 01-Sep-1960 [id number]  Ortho Devices Type of Ortho Device: Postop shoe/boot Ortho Device/Splint Location: LLE Ortho Device/Splint Interventions: Ordered, Application   Jennye MoccasinHughes, Eara Burruel [facility] 11/05/2014, 7:30 PM

## 2014-11-05 NOTE — H&P (Signed)
Anne Byrd is an 54 y.o. female.   Chief Complaint: Abscess right foot HPI: Patient is a 54 year old woman status post subtalar and talonavicular fusion 1 month ago who presents at this time with a small draining abscess on the dorsum of her right foot.  Past Medical History  Diagnosis Date  . DVT of leg (deep venous thrombosis)     left  . Family history of adverse reaction to anesthesia     Patients mother had a difficult time waking you after anesthesia  . GERD (gastroesophageal reflux disease)     Takes Nexium OTC if needed  . Hot flashes     gets hot flashes when pt is gaining weight    Past Surgical History  Procedure Laterality Date  . Leg surgery Left   . Cervical disc surgery  2004  . Colonoscopy    . Abdominal hysterectomy    . Ankle fusion Right 10/07/2014    Procedure: Subtalar and Talonavicular Fusion;  Surgeon: Newt Minion, MD;  Location: [facility];  Service: Orthopedics;  Laterality: Right;  . Breast biopsy Left 12/2012    Family History  Problem Relation Age of Onset  . Hypertension Mother   . GER disease Mother   . Heart attack Father    Social History:  reports that she has never smoked. She does not have any smokeless tobacco history on file. She reports that she drinks alcohol. She reports that she does not use illicit drugs.  Allergies:  Allergies  Allergen Reactions  . Other Anaphylaxis    Black Walnuts    Medications Prior to Admission  Medication Sig Dispense Refill  . acetaminophen (TYLENOL) 500 MG tablet Take 1,000 mg by mouth every 6 (six) hours as needed (pain).    Marland Kitchen doxycycline (VIBRAMYCIN) 100 MG capsule Take 100 mg by mouth 2 (two) times daily.    Marland Kitchen oxyCODONE-acetaminophen (ROXICET) 5-325 MG per tablet Take 1 tablet by mouth every 4 (four) hours as needed for severe pain. 60 tablet 0    Results for orders placed or performed during the hospital encounter of 11/05/14 (from the past 48 hour(s))  APTT     Status: None   Collection Time:  11/05/14  9:39 AM  Result Value Ref Range   aPTT 30 24 - 37 seconds  CBC     Status: Abnormal   Collection Time: 11/05/14  9:39 AM  Result Value Ref Range   WBC 5.3 4.0 - 10.5 K/uL   RBC 4.01 3.87 - 5.11 MIL/uL   Hemoglobin 11.4 (L) 12.0 - 15.0 g/dL   HCT 35.2 (L) 36.0 - 46.0 %   MCV 87.8 78.0 - 100.0 fL   MCH 28.4 26.0 - 34.0 pg   MCHC 32.4 30.0 - 36.0 g/dL   RDW 13.4 11.5 - 15.5 %   Platelets 271 150 - 400 K/uL  Comprehensive metabolic panel     Status: Abnormal   Collection Time: 11/05/14  9:39 AM  Result Value Ref Range   Sodium 140 135 - 145 mmol/L   Potassium 4.1 3.5 - 5.1 mmol/L   Chloride 104 96 - 112 mmol/L   CO2 29 19 - 32 mmol/L   Glucose, Bld 97 70 - 99 mg/dL   BUN 10 6 - 23 mg/dL   Creatinine, Ser 0.84 0.50 - 1.10 mg/dL   Calcium 9.5 8.4 - 10.5 mg/dL   Total Protein 7.5 6.0 - 8.3 g/dL   Albumin 3.4 (L) 3.5 - 5.2 g/dL  AST 24 0 - 37 U/L   ALT 22 0 - 35 U/L   Alkaline Phosphatase 89 39 - 117 U/L   Total Bilirubin 0.6 0.3 - 1.2 mg/dL   GFR calc non Af Amer 77 (L) >90 mL/min   GFR calc Af Amer 90 (L) >90 mL/min    Comment: (NOTE) The eGFR has been calculated using the CKD EPI equation. This calculation has not been validated in all clinical situations. eGFR's persistently <90 mL/min signify possible Chronic Kidney Disease.    Anion gap 7 5 - 15  Protime-INR     Status: None   Collection Time: 11/05/14  9:39 AM  Result Value Ref Range   Prothrombin Time 14.1 11.6 - 15.2 seconds   INR 1.08 0.00 - 1.49   No results found.  Review of Systems  All other systems reviewed and are negative.   Blood pressure 124/71, pulse 82, temperature 98 F (36.7 C), temperature source Oral, resp. rate 20, height _0  (1.626 m), weight 100.699 kg (222 lb), SpO2 100 %. Physical Exam  Patient has a small draining abscess on the dorsum of her right foot Assessment/Plan  Abscess dorsum right foot.  Plan: We'll plan for excisional debridement and placement of antibiotic  beads. Risk and benefits of surgery were discussed patient states she understands wishes to proceed at this time.   Alphus Zeck V 11/05/2014, 12:29 PM

## 2014-11-06 DIAGNOSIS — L02611 Cutaneous abscess of right foot: Secondary | ICD-10-CM | POA: Diagnosis not present

## 2014-11-06 NOTE — Evaluation (Signed)
Physical Therapy Evaluation/Discharge Patient Details Name: Anne Byrd MRN: [id number] DOB: Aug 28, 1961 Today's Date: 11/06/2014   History of Present Illness  54 y.o. female admitted to [facility] on 11/05/14 due to right foot abcess and is now s/p I& D to the right foot.  Pt with significant PMHx of DVT, migraine, cervical disc surgery, R ankle fusion (10/07/2014), and back surgery.    Clinical Impression  Pt is able to mobilize safely with RW, limited in distance today due to throbbing pain with foot in dependent position.  Educated re: post op shoe use and what TDWB means.  Also educated re: elevation for both pain and swelling control.  Pt has excellent support at home and is essentially (minus more pain) at her baseline level of function since her original surgery in Feb.  PT to sign off.  All education completed.      Follow Up Recommendations No PT follow up    Equipment Recommendations  None recommended by PT    Recommendations for Other Services   NA    Precautions / Restrictions Precautions Required Braces or Orthoses: Other Brace/Splint Other Brace/Splint: post op shoe Restrictions RLE Weight Bearing: Touchdown weight bearing      Mobility  Bed Mobility Overal bed mobility: Modified Independent             General bed mobility comments: Pt able to get EOB easily with upper extremity assist.   Transfers Overall transfer level: Modified independent Equipment used: Rolling walker (2 wheeled)             General transfer comment: sit to stand transition with RW and safe hand placement demonstrated.  Pt does rely on hands for transitions.   Ambulation/Gait Ambulation/Gait assistance: Min guard Ambulation Distance (Feet): 20 Feet Assistive device: Rolling walker (2 wheeled) Gait Pattern/deviations: Step-to pattern (hop to)     General Gait Details: Pt prefered NWB due to throbbing pain vs her newly prescribed TDWB.  I did educate her that TDWB is only slightly  more than NWB and that she needs to have the protective shoe on any time she is up and moving.   Stairs Stairs:  (per pt report level entry and husband helps her through door)              Balance Overall balance assessment: Needs assistance Sitting-balance support: Feet supported;No upper extremity supported Sitting balance-Leahy Scale: Good     Standing balance support: Bilateral upper extremity supported;Single extremity supported Standing balance-Leahy Scale: Fair                               Pertinent Vitals/Pain Pain Assessment: 0-10 Pain Score: 5  Pain Location: right foot Pain Descriptors / Indicators: Aching;Burning Pain Intervention(s): Limited activity within patient's tolerance;Premedicated before session;Monitored during session;Repositioned    Home Living Family/patient expects to be discharged to:: Private residence Living Arrangements: Spouse/significant other;Children Available Help at Discharge: Family;Available 24 hours/day Type of Home: House Home Access: Level entry     Home Layout: One level Home Equipment: Walker - 2 wheels;Wheelchair - manual      Prior Function Level of Independence: Independent with assistive device(s)         Comments: Using WC for community access, RW for home ambulation.      Hand Dominance   Dominant Hand: Right    Extremity/Trunk Assessment   Upper Extremity Assessment: Overall WFL for tasks assessed  Lower Extremity Assessment: RLE deficits/detail RLE Deficits / Details: right leg with ankle deficits due to post op status.  Pt can wiggle toes and has intact sensation in right toes.  Did not test ankle ROM or strength.  Very heavily bandaged. I did donn post op shoe and reviewed use with pt.    Cervical / Trunk Assessment: Other exceptions  Communication   Communication: No difficulties  Cognition Arousal/Alertness: Awake/alert Behavior During Therapy: WFL for tasks  assessed/performed Overall Cognitive Status: Within Functional Limits for tasks assessed                               Assessment/Plan    PT Assessment Patent does not need any further PT services  PT Diagnosis Difficulty walking;Abnormality of gait;Generalized weakness;Acute pain         PT Goals (Current goals can be found in the Care Plan section) Acute Rehab PT Goals Patient Stated Goal: to decrease pain PT Goal Formulation: All assessment and education complete, DC therapy     End of Session   Activity Tolerance: Patient limited by pain Patient left: in bed;with call bell/phone within reach;with family/visitor present Nurse Communication: Mobility status    Functional Assessment Tool Used: assist level Functional Limitation: Mobility: Walking and moving around Mobility: Walking and Moving Around Current Status ([id number](G8978): At least 1 percent but less than 20 percent impaired, limited or restricted Mobility: Walking and Moving Around Goal Status [phone](G8979): At least 1 percent but less than 20 percent impaired, limited or restricted Mobility: Walking and Moving Around Discharge Status [phone](G8980): At least 1 percent but less than 20 percent impaired, limited or restricted    Time: 1202-1230 PT Time Calculation (min) (ACUTE ONLY): 28 min   Charges:   PT Evaluation $Initial PT Evaluation Tier I: 1 Procedure PT Treatments $Therapeutic Activity: 8-22 mins   PT G Codes:   PT G-Codes **NOT FOR INPATIENT CLASS** Functional Assessment Tool Used: assist level Functional Limitation: Mobility: Walking and moving around Mobility: Walking and Moving Around Current Status ([id number](G8978): At least 1 percent but less than 20 percent impaired, limited or restricted Mobility: Walking and Moving Around Goal Status [phone](G8979): At least 1 percent but less than 20 percent impaired, limited or restricted Mobility: Walking and Moving Around Discharge Status [phone](G8980): At least 1 percent but less than 20  percent impaired, limited or restricted    Starleen Trussell B. Tila Millirons, PT, DPT [phone]   11/06/2014, 12:46 PM

## 2014-11-06 NOTE — Discharge Summary (Signed)
  Discharge to home in stable condition. Surgical procedure irrigation and debridement and placement of antibiotic beads for abscess right foot. Follow-up in the office in 1 week. Patient's hospital course was essentially unremarkable she was admitted for observation was discharged to home in stable condition.

## 2014-11-06 NOTE — Progress Notes (Signed)
CARE MANAGEMENT NOTE 11/06/2014  Patient:  Anne Byrd,Anne D   Account Number:  [id number]  Date Initiated:  11/06/2014  Documentation initiated by:  [facility]Kashvi Prevette  Subjective/Objective Assessment:   s/p I and D of rt foot abscess with placement of antibx beads     Action/Plan:   PT eval- no follow up or equipment needs identified   Anticipated DC Date:  11/06/2014   Anticipated DC Plan:  HOME/SELF CARE      DC Planning Services  CM consult      Status of service:  Completed, signed off  Discharge Disposition:  HOME/SELF CARE  Per UR Regulation:  Reviewed for med. necessity/level of care/duration of stay

## 2014-11-06 NOTE — Progress Notes (Signed)
Discharge instructions and prescriptions completed by ADON. Left floor via w/c with family and staff member to be driven home private vehicle.

## 2014-11-08 ENCOUNTER — Encounter (HOSPITAL_COMMUNITY): Payer: Self-pay | Admitting: Orthopedic Surgery

## 2014-11-08 LAB — TISSUE CULTURE: GRAM STAIN: NONE SEEN

## 2016-05-30 IMAGING — CR DG CHEST 2V
2 series · 2 of 2 positions shown · non-contrast
Comparison: None

CLINICAL DATA: Preoperative exam prior to subtalar and
talonavicular fusion procedure.

EXAM:
CHEST  2 VIEW

[w chest pa]
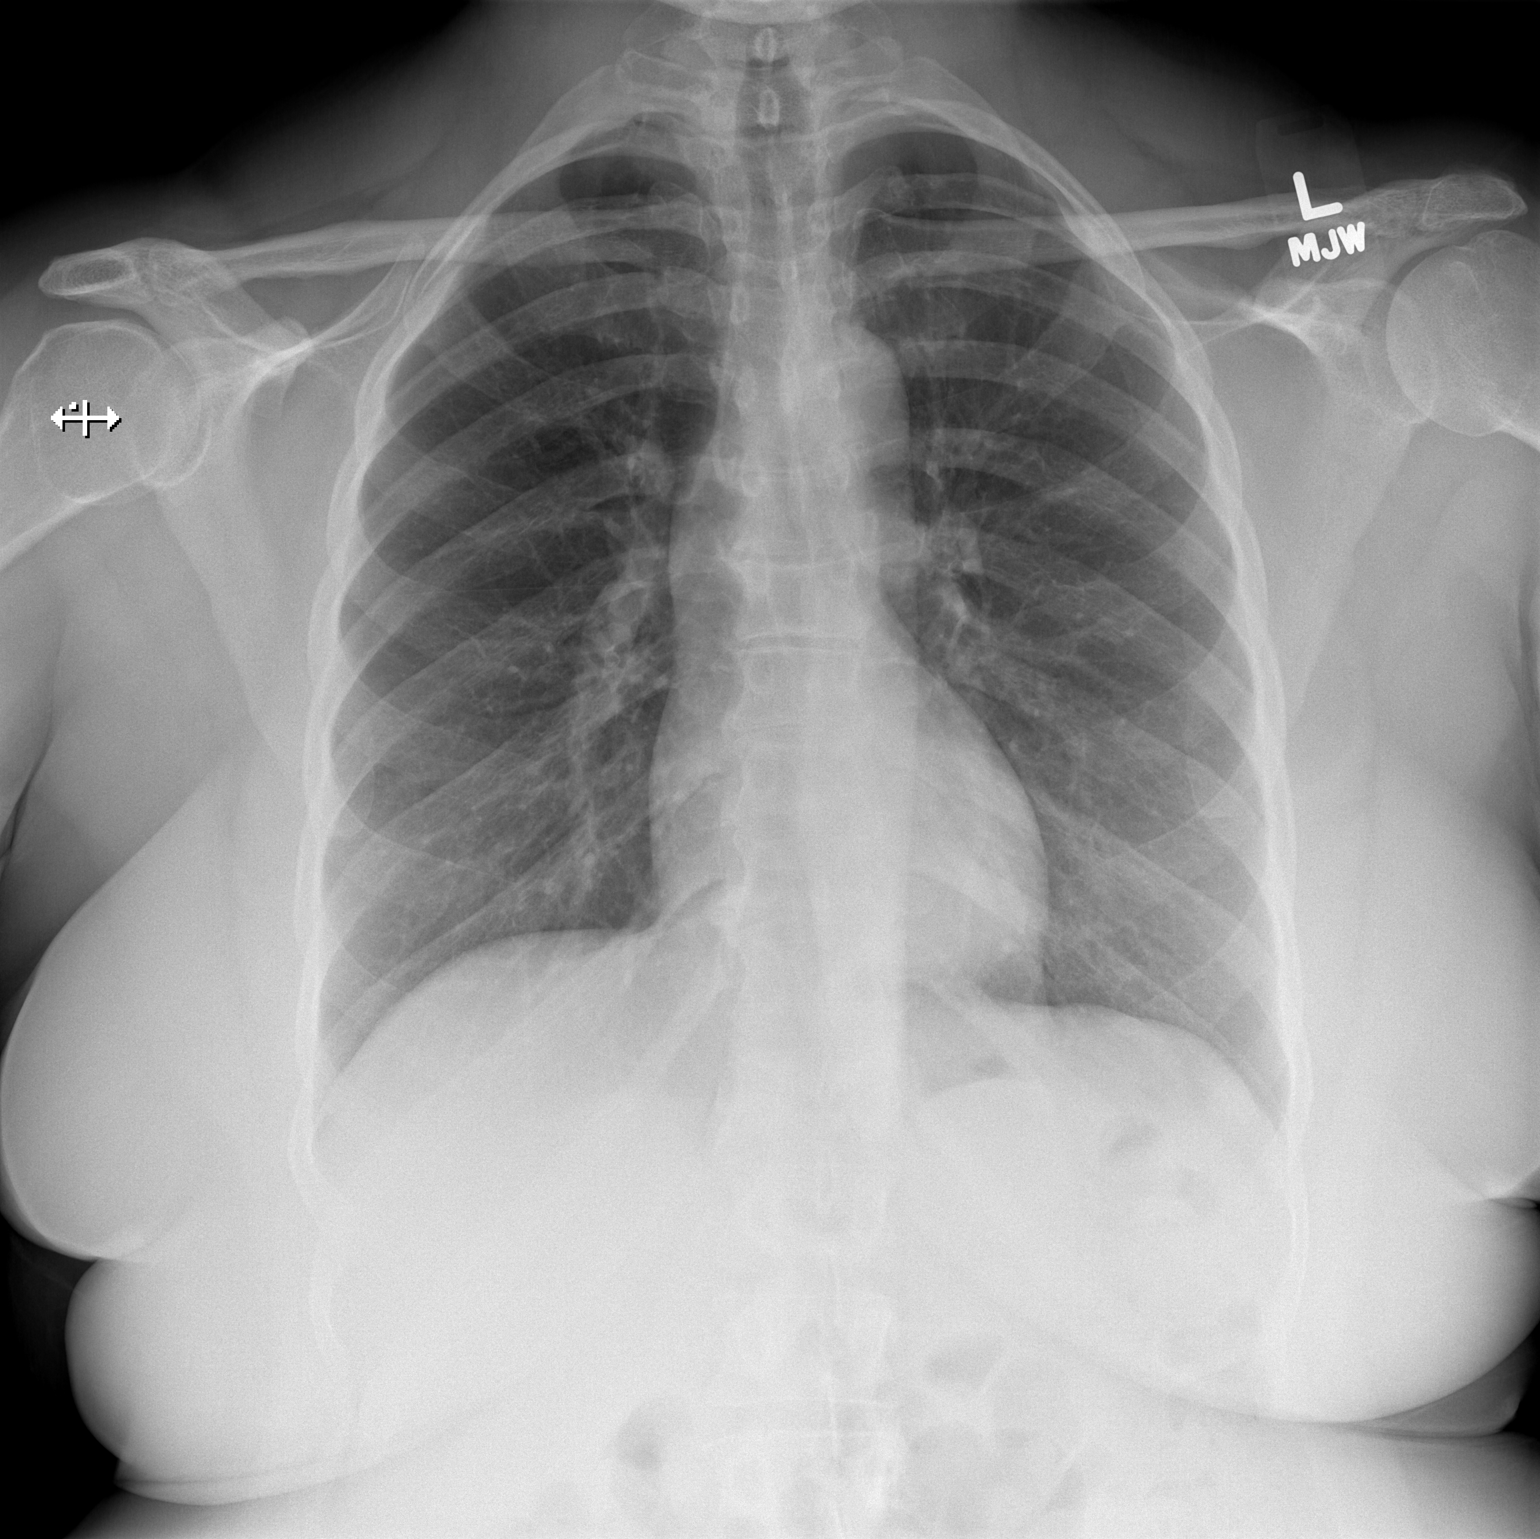

[w chest lat]
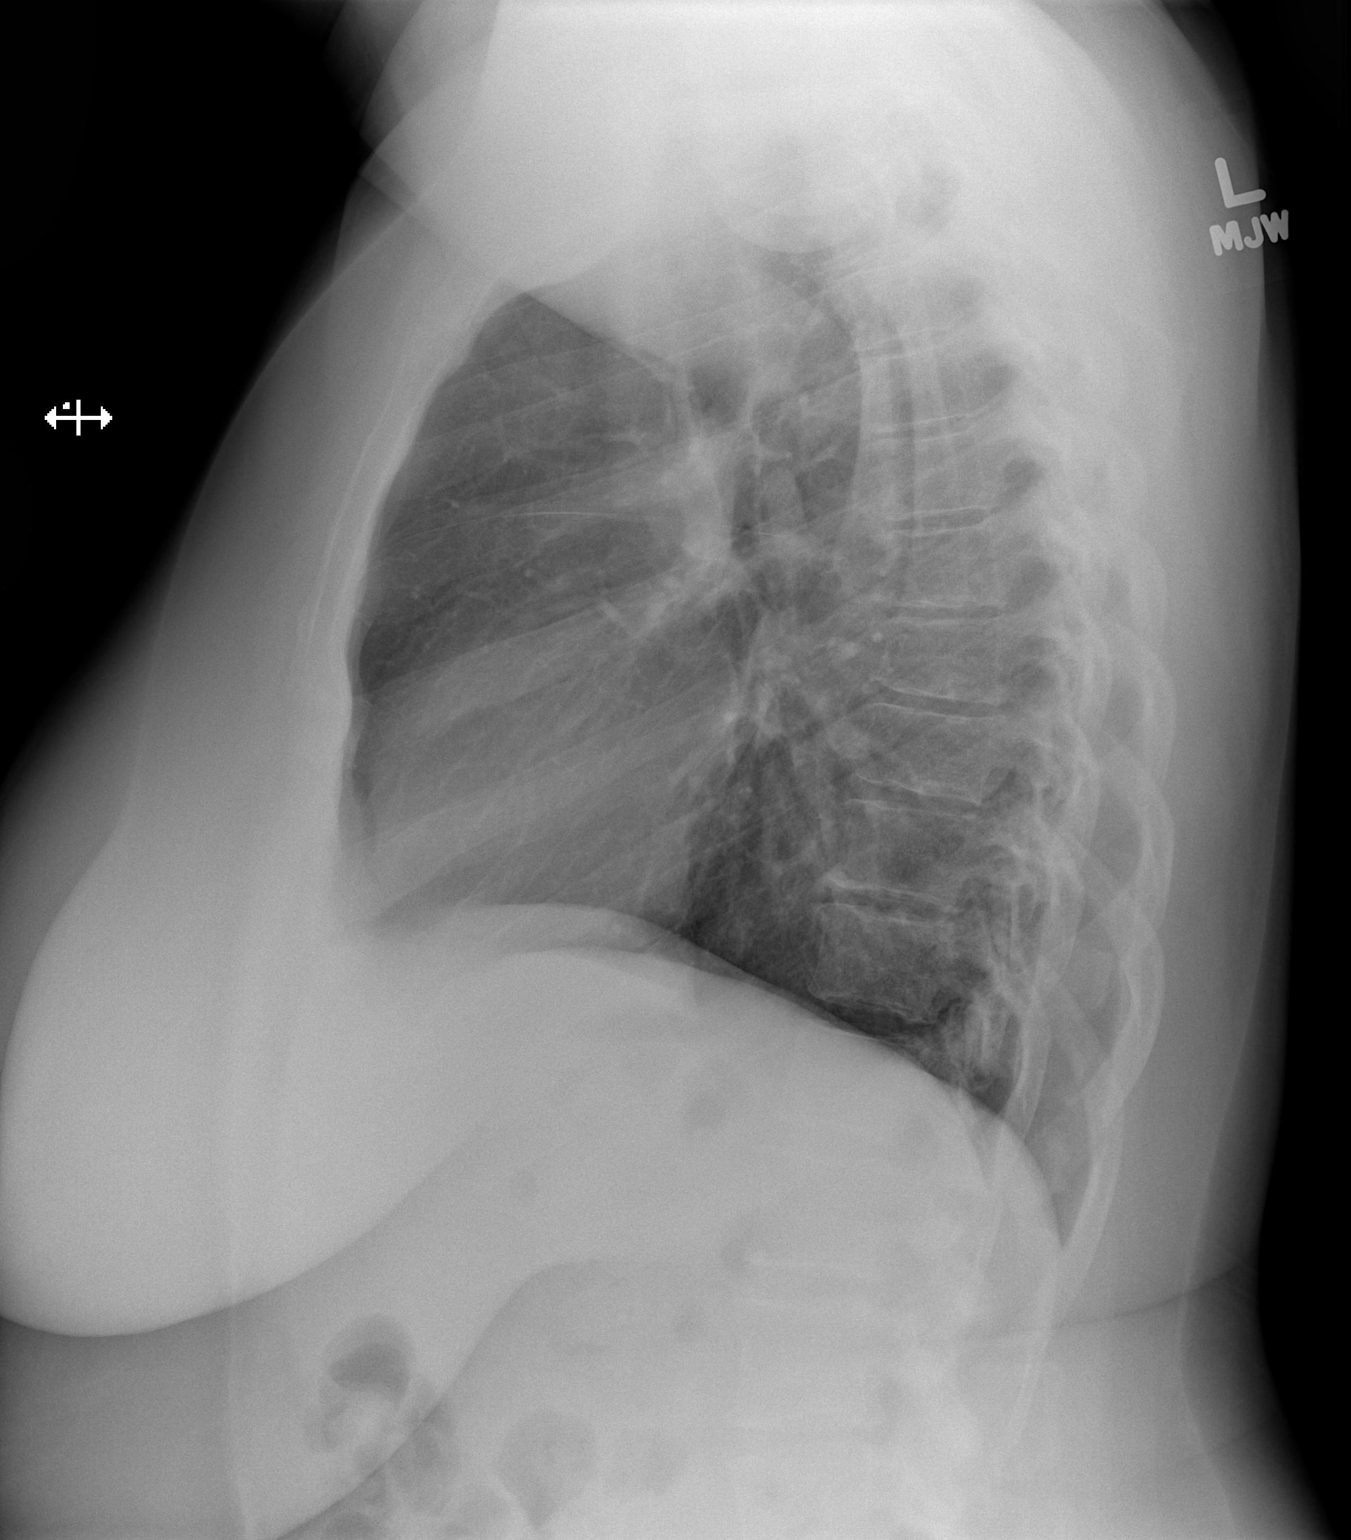

[2 of 2 positions shown; findings below may reference images not displayed]

FINDINGS: The lungs are well-expanded and clear. The heart and pulmonary
vascularity are normal. The mediastinum is normal in width. There is
no pleural effusion or pneumothorax. The bony thorax is
unremarkable.
IMPRESSION: There is no active cardiopulmonary disease.

## 2017-12-27 ENCOUNTER — Telehealth (INDEPENDENT_AMBULATORY_CARE_PROVIDER_SITE_OTHER): Payer: Self-pay | Admitting: Orthopedic Surgery

## 2017-12-27 NOTE — Telephone Encounter (Signed)
Returned call to patient left message to call back to schedule appointment with Dr Duda  °

## 2018-01-10 ENCOUNTER — Encounter (INDEPENDENT_AMBULATORY_CARE_PROVIDER_SITE_OTHER): Payer: Self-pay | Admitting: Orthopedic Surgery

## 2018-01-10 ENCOUNTER — Ambulatory Visit (INDEPENDENT_AMBULATORY_CARE_PROVIDER_SITE_OTHER): Payer: Medicare HMO

## 2018-01-10 ENCOUNTER — Ambulatory Visit (INDEPENDENT_AMBULATORY_CARE_PROVIDER_SITE_OTHER): Payer: Medicare HMO | Admitting: Orthopedic Surgery

## 2018-01-10 VITALS — Ht 64.0 in | Wt 222.0 lb

## 2018-01-10 DIAGNOSIS — M79671 Pain in right foot: Secondary | ICD-10-CM | POA: Diagnosis not present

## 2018-01-10 DIAGNOSIS — M6701 Short Achilles tendon (acquired), right ankle: Secondary | ICD-10-CM

## 2018-01-10 DIAGNOSIS — M2021 Hallux rigidus, right foot: Secondary | ICD-10-CM

## 2018-01-10 NOTE — Progress Notes (Signed)
Office Visit Note   Patient: Anne Byrd           Date of Birth: [birth year]           MRN: [id number] Visit Date: 01/10/2018              Requested by: Carlye Grippe, MD [address] [place], [place] [address] PCP: Carlye Grippe, MD  Chief Complaint  Patient presents with  . Right Foot - Pain    Hx subtalar and talonavicular fusion 09/2014 and I&D right foot 10/2014      HPI: Patient presents for evaluation for pain great MTP joint.  She is status post a subtalar and talonavicular fusion 3 years ago.  She denies any pain in the hindfoot but is been having pain in the great toe MTP joint with numbness and tingling after being on her feet all day long also complains of venous swelling.  She states she wore the compression socks while on vacation and her legs felt great.  Assessment & Plan: Visit Diagnoses:  1. Pain in right foot   2. Hallux rigidus, right foot   3. Achilles tendon contracture, right     Plan: Recommended resuming using her compression stockings to decrease swelling from the venous insufficiency.  Recommended a stiff soled sneaker to prevent pressure on the hallux rigidus and patient was given instructions and demonstrated heel cord stretching for her Achilles contracture.  Follow-Up Instructions: Return if symptoms worsen or fail to improve.   Ortho Exam  Patient is alert, oriented, no adenopathy, well-dressed, normal affect, normal respiratory effort. Examination patient has good pulses she has dorsiflexion about 20 degrees short of neutral with her knee extended.  She does have some mild keloiding of her scar.  The subtalar joint is asymptomatic.  She has dorsiflexion of only 20 degrees of the great toe MTP joint due to hallux rigidus.  Radiographs shows arthritic changes of the MTP joint with a good healing of the talonavicular and subtalar joint.  Imaging: Xr Foot Complete Right  Result Date: 01/10/2018 3 view radiographs of the right foot shows  good consolidation of fusion of the talonavicular and subtalar joint there is no collapse of the calcaneocuboid joint and this is congruent.  Patient does have arthritic changes of bony spurs at the MTP joint great toe with subcondylar sclerosis.  No images are attached to the encounter.  Labs: Lab Results  Component Value Date   REPTSTATUS 11/08/2014 FINAL 11/05/2014   GRAMSTAIN  11/05/2014    NO WBC SEEN NO ORGANISMS SEEN Performed at [facility]    CULT  11/05/2014    FEW METHICILLIN RESISTANT STAPHYLOCOCCUS AUREUS Note: RIFAMPIN AND GENTAMICIN SHOULD NOT BE USED AS SINGLE DRUGS FOR TREATMENT OF STAPH INFECTIONS. This organism DOES NOT demonstrate inducible Clindamycin resistance in vitro. Performed at [facility] METHICILLIN RESISTANT STAPHYLOCOCCUS AUREUS 11/05/2014     Lab Results  Component Value Date   ALBUMIN 3.4 (L) 11/05/2014   ALBUMIN 4.0 10/01/2014    Body mass index is 38.11 kg/m.  Orders:  Orders Placed This Encounter  Procedures  . XR Foot Complete Right   No orders of the defined types were placed in this encounter.    Procedures: No procedures performed  Clinical Data: No additional findings.  ROS:  All other systems negative, except as noted in the HPI. Review of Systems  Objective: Vital Signs: Ht  (1.626 m)   Wt 222 lb (  100.7 kg)   BMI 38.11 kg/m   Specialty Comments:  No specialty comments available.  PMFS History: Patient Active Problem List   Diagnosis Date Noted  . Abscess of right foot excluding toes 11/05/2014  . 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 DVT of leg (deep venous thrombosis) (HCC)    left  . Family history of adverse reaction to anesthesia    Patients mother had a difficult time waking you after anesthesia  . GERD (gastroesophageal reflux disease)    Takes Nexium OTC if needed  . Hot flashes    gets hot  flashes when pt is gaining weight  . Migraine    "usually related to my periods" (11/05/2014)    Family History  Problem Relation Age of Onset  . Hypertension Mother   . GER disease Mother   . Heart attack Father     Past Surgical History:  Procedure Laterality Date  . ABDOMINAL HYSTERECTOMY    . ANKLE FUSION Right 10/07/2014   Procedure: Subtalar and Talonavicular Fusion;  Surgeon: Nadara Mustard, MD;  Location: [facility];  Service: Orthopedics;  Laterality: Right;  . BACK SURGERY    . BREAST BIOPSY Left 12/2012  . CERVICAL DISC SURGERY  2004  . COLONOSCOPY    . I&D EXTREMITY Right 11/05/2014   Procedure: IRRIGATION AND DEBRIDEMENT RIGHT FOOT, PLACE ANTIBIOTIC BEADS;  Surgeon: Nadara Mustard, MD;  Location: [facility];  Service: Orthopedics;  Laterality: Right;  . INCISION AND DRAINAGE FOOT Right 11/05/2014   PLACE ANTIBIOTIC BEADS   . THROMBECTOMY Left 2014   "had blood clot in my leg"   Social History   Occupational History  . Not on file  Tobacco Use  . Smoking status: Never Smoker  . Smokeless tobacco: Never Used  Substance and Sexual Activity  . Alcohol use: Yes    Comment: 11/05/2014 "I'll have a wine cooler a few times/yr"  . Drug use: No  . Sexual activity: Not Currently

## 2018-01-10 NOTE — Progress Notes (Signed)
xr right foot
